# Patient Record
Sex: Male | Born: 1995 | Race: Black or African American | Hispanic: No | Marital: Single | Smoking: Never smoker
Health system: Southern US, Community
[De-identification: ages and names within clinical notes are randomized; demographics above are authoritative.]

## PROBLEM LIST (undated history)

## (undated) HISTORY — PX: EYE SURGERY: SHX253

## (undated) HISTORY — PX: TRACHEOSTOMY: SUR1362

---
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
## 2018-02-08 ENCOUNTER — Ambulatory Visit (INDEPENDENT_AMBULATORY_CARE_PROVIDER_SITE_OTHER)
Admission: EM | Admit: 2018-02-08 | Discharge: 2018-02-08 | Disposition: A | Discharge status: Home / Self Care | Payer: BLUE CROSS/BLUE SHIELD | Source: Home / Self Care | Attending: Family Medicine | Admitting: Family Medicine

## 2018-02-08 ENCOUNTER — Encounter (HOSPITAL_COMMUNITY): Payer: Self-pay

## 2018-02-08 ENCOUNTER — Encounter (HOSPITAL_COMMUNITY): Payer: Self-pay | Admitting: Emergency Medicine

## 2018-02-08 ENCOUNTER — Emergency Department (HOSPITAL_COMMUNITY)
Admission: EM | Admit: 2018-02-08 | Discharge: 2018-02-08 | Disposition: A | Discharge status: Home / Self Care | Payer: BLUE CROSS/BLUE SHIELD | Attending: Emergency Medicine | Admitting: Emergency Medicine

## 2018-02-08 ENCOUNTER — Other Ambulatory Visit: Payer: Self-pay

## 2018-02-08 DIAGNOSIS — Y9367 Activity, basketball: Secondary | ICD-10-CM | POA: Insufficient documentation

## 2018-02-08 DIAGNOSIS — S01512A Laceration without foreign body of oral cavity, initial encounter: Secondary | ICD-10-CM

## 2018-02-08 DIAGNOSIS — Y92310 Basketball court as the place of occurrence of the external cause: Secondary | ICD-10-CM | POA: Diagnosis not present

## 2018-02-08 DIAGNOSIS — S01511A Laceration without foreign body of lip, initial encounter: Secondary | ICD-10-CM | POA: Insufficient documentation

## 2018-02-08 DIAGNOSIS — W51XXXA Accidental striking against or bumped into by another person, initial encounter: Secondary | ICD-10-CM | POA: Diagnosis not present

## 2018-02-08 DIAGNOSIS — Y998 Other external cause status: Secondary | ICD-10-CM | POA: Insufficient documentation

## 2018-02-08 DIAGNOSIS — S0181XA Laceration without foreign body of other part of head, initial encounter: Secondary | ICD-10-CM | POA: Insufficient documentation

## 2018-02-08 MED ORDER — CHLORHEXIDINE GLUCONATE 0.12 % MT SOLN: 15.0000 mL | Freq: Two times a day (BID) | OROMUCOSAL | 0 refills | Status: DC

## 2018-02-08 MED ORDER — LIDOCAINE-EPINEPHRINE (PF) 2 %-1:200000 IJ SOLN
10.0000 mL | Freq: Once | INTRAMUSCULAR | Status: AC
  Administered 2018-02-08: 10 mL
  Filled 2018-02-08: qty 20

## 2018-02-08 MED ORDER — OXYCODONE-ACETAMINOPHEN 5-325 MG PO TABS
1.0000 | ORAL_TABLET | Freq: Once | ORAL | Status: AC
  Administered 2018-02-08: 1 via ORAL
  Filled 2018-02-08: qty 1

## 2018-02-08 NOTE — Discharge Instructions (Addendum)
GO TO ER °

## 2018-02-08 NOTE — ED Provider Notes (Signed)
[facility]    CSN: [id number] Arrival date & time: 02/08/18  1508     History   Chief Complaint Chief Complaint  Patient presents with  . Lip Laceration    HPI Vitaly Jasmer is a 23 y.o. male.   HPI  Patient was elbowed in the face during a basketball game.  He has a laceration on the side of his face, near the right lip. As I examine the laceration I realize it is a through and through laceration with a large opening inside his mouth, connecting. This is more complicated than we can repair at the urgent care center and I triaged him to the emergency room.  History reviewed. No pertinent past medical history.  There are no active problems to display for this patient.   Past Surgical History:  Procedure Laterality Date  . EYE SURGERY    . TRACHEOSTOMY         Home Medications    Prior to Admission medications   Medication Sig Start Date End Date Taking? Authorizing Provider  chlorhexidine (PERIDEX) 0.12 % solution Use as directed 15 mLs in the mouth or throat 2 (two) times daily. 02/08/18   Dietrich Pates, PA-C    Family History Family History  Problem Relation Age of Onset  . Healthy Mother   . Healthy Father     Social History Social History   Tobacco Use  . Smoking status: Never Smoker  . Smokeless tobacco: Never Used  Substance Use Topics  . Alcohol use: No  . Drug use: No     Allergies   Patient has no known allergies.   Review of Systems Review of Systems  Skin: Positive for wound.     Physical Exam Triage Vital Signs ED Triage Vitals  Enc Vitals Group     BP 02/08/18 1655 120/77     Pulse Rate 02/08/18 1655 71     Resp 02/08/18 1655 18     Temp 02/08/18 1655 99.3 F (37.4 C)     Temp Source 02/08/18 1655 Temporal     SpO2 02/08/18 1655 100 %     Weight --      Height --      Head Circumference --      Peak Flow --      Pain Score 02/08/18 1653 3     Pain Loc --      Pain Edu? --      Excl. in GC? --    No  data found.  Updated Vital Signs BP 120/77 (BP Location: Right Arm)   Pulse 71   Temp 99.3 F (37.4 C) (Temporal)   Resp 18   SpO2 100%   Visual Acuity Right Eye Distance:   Left Eye Distance:   Bilateral Distance:    Right Eye Near:   Left Eye Near:    Bilateral Near:     Physical Exam Constitutional:      General: He is not in acute distress.    Appearance: He is well-developed.  HENT:     Head: Normocephalic and atraumatic.   Eyes:     Conjunctiva/sclera: Conjunctivae normal.     Pupils: Pupils are equal, round, and reactive to light.  Neck:     Musculoskeletal: Normal range of motion.  Cardiovascular:     Rate and Rhythm: Normal rate.  Pulmonary:     Effort: Pulmonary effort is normal. No respiratory distress.  Abdominal:     General: There is no  distension.     Palpations: Abdomen is soft.  Musculoskeletal: Normal range of motion.  Skin:    General: Skin is warm and dry.  Neurological:     Mental Status: He is alert.  Psychiatric:        Mood and Affect: Mood normal.        Thought Content: Thought content normal.      UC Treatments / Results  Labs (all labs ordered are listed, but only abnormal results are displayed) Labs Reviewed - No data to display  EKG None  Radiology No results found.  Procedures Procedures (including critical care time)  Medications Ordered in UC Medications - No data to display  Initial Impression / Assessment and Plan / UC Course  I have reviewed the triage vital signs and the nursing notes.  Pertinent labs & imaging results that were available during my care of the patient were reviewed by me and considered in my medical decision making (see chart for details).      Final Clinical Impressions(s) / UC Diagnoses   Final diagnoses:  Complex laceration of circumoral region of face     Discharge Instructions     GO TO ER   ED Prescriptions    None     Controlled Substance Prescriptions [place] Controlled  Substance Registry consulted? Not Applicable   Eustace MooreNelson, Cheikh Bramble Sue, MD 02/08/18 2050
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
## 2018-02-08 NOTE — ED Provider Notes (Signed)
[facility] EMERGENCY DEPARTMENT Provider Note   CSN: [id number] Arrival date & time: 02/08/18  1749     History   Chief Complaint Chief Complaint  Patient presents with  . Laceration    HPI Howard Franklin is a 23 y.o. male who presents to ED for laceration that occurred 3 hours prior to arrival.  States that he was playing basketball when another player elbowed him in the corner of his mouth.  He is up-to-date on tetanus.  Denies loss of consciousness or other injuries, loose teeth.  HPI  History reviewed. No pertinent past medical history.  There are no active problems to display for this patient.   Past Surgical History:  Procedure Laterality Date  . EYE SURGERY    . TRACHEOSTOMY          Home Medications    Prior to Admission medications   Medication Sig Start Date End Date Taking? Authorizing Provider  chlorhexidine (PERIDEX) 0.12 % solution Use as directed 15 mLs in the mouth or throat 2 (two) times daily. 02/08/18   Dietrich Pates, PA-C    Family History Family History  Problem Relation Age of Onset  . Healthy Mother   . Healthy Father     Social History Social History   Tobacco Use  . Smoking status: Never Smoker  . Smokeless tobacco: Never Used  Substance Use Topics  . Alcohol use: No  . Drug use: No     Allergies   Patient has no known allergies.   Review of Systems Review of Systems  Gastrointestinal: Negative for vomiting.  Skin: Positive for wound.  Neurological: Negative for syncope and headaches.     Physical Exam Updated Vital Signs BP 116/79   Pulse 77   Temp 98.5 F (36.9 C) (Oral)   Resp 16   Ht 5\' 11"  (1.803 m)   Wt 65.8 kg   SpO2 100%   BMI 20.22 kg/m   Physical Exam Vitals signs and nursing note reviewed.  Constitutional:      General: He is not in acute distress.    Appearance: He is well-developed. He is not diaphoretic.  HENT:     Head: Normocephalic and atraumatic.  Eyes:     General:  No scleral icterus.    Conjunctiva/sclera: Conjunctivae normal.  Neck:     Musculoskeletal: Normal range of motion.  Pulmonary:     Effort: Pulmonary effort is normal. No respiratory distress.  Skin:    Findings: No rash.     Comments: Laceration at corner of mouth going through.  Neurological:     Mental Status: He is alert.          ED Treatments / Results  Labs (all labs ordered are listed, but only abnormal results are displayed) Labs Reviewed - No data to display  EKG None  Radiology No results found.  Procedures .Marland KitchenLaceration Repair Date/Time: 02/08/2018 8:09 PM Performed by: Dietrich Pates, PA-C Authorized by: Dietrich Pates, PA-C   Consent:    Consent obtained:  Verbal   Consent given by:  Patient   Risks discussed:  Infection, need for additional repair, nerve damage, pain, poor cosmetic result, poor wound healing, retained foreign body, tendon damage and vascular damage Anesthesia (see MAR for exact dosages):    Anesthesia method:  Local infiltration   Local anesthetic:  Lidocaine 2% WITH epi Laceration details:    Location:  Lip   Lip location:  Upper exterior lip   Length (cm):  1.5  Repair type:    Repair type:  Simple Exploration:    Hemostasis achieved with:  Direct pressure Treatment:    Area cleansed with:  Saline   Amount of cleaning:  Standard   Irrigation solution:  Sterile saline Skin repair:    Repair method:  Sutures   Suture size:  4-0   Suture material:  Nylon (Vicryl rapide)   Suture technique:  Simple interrupted   Number of sutures:  4 Approximation:    Approximation:  Close   Vermilion border: well-aligned   Post-procedure details:    Patient tolerance of procedure:  Tolerated well, no immediate complications  .Marland KitchenLaceration Repair Date/Time: 02/08/2018 8:11 PM Performed by: Dietrich Pates, PA-C Authorized by: Dietrich Pates, PA-C   Consent:    Consent obtained:  Verbal   Consent given by:  Patient   Risks discussed:   Infection, need for additional repair, nerve damage, pain, poor wound healing, retained foreign body, vascular damage, poor cosmetic result and tendon damage Anesthesia (see MAR for exact dosages):    Anesthesia method:  Local infiltration   Local anesthetic:  Lidocaine 2% WITH epi Laceration details:    Location:  Lip   Lip location:  Upper interior lip   Length (cm):  1.5 Pre-procedure details:    Preparation:  Patient was prepped and draped in usual sterile fashion Treatment:    Area cleansed with:  Saline   Amount of cleaning:  Standard   Irrigation solution:  Sterile saline   Irrigation method:  Syringe and pressure wash Skin repair:    Repair method:  Sutures   Suture size:  4-0   Wound skin closure material used: vicryl rapide.   Suture technique:  Simple interrupted   Number of sutures:  3 Approximation:    Approximation:  Loose Post-procedure details:    Patient tolerance of procedure:  Tolerated well, no immediate complications   (including critical care time)  Medications Ordered in ED Medications  lidocaine-EPINEPHrine (XYLOCAINE W/EPI) 2 %-1:200000 (PF) injection 10 mL (10 mLs Infiltration Given by Other 02/08/18 1851)  oxyCODONE-acetaminophen (PERCOCET/ROXICET) 5-325 MG per tablet 1 tablet (1 tablet Oral Given 02/08/18 1858)     Initial Impression / Assessment and Plan / ED Course  I have reviewed the triage vital signs and the nursing notes.  Pertinent labs & imaging results that were available during my care of the patient were reviewed by me and considered in my medical decision making (see chart for details).     Patient counseled on wound care. Patient counseled on need to return or see PCP/urgent care for suture removal in 5 days. Patient was urged to return to the Emergency Department urgently with worsening pain, swelling, expanding erythema especially if it streaks away from the affected area, fever, or if they have any other concerns.  Will discharge  home with chlorhexidine mouthwash for internal laceration.  Patient verbalized understanding.   Patient is hemodynamically stable, in NAD, and able to ambulate in the ED. Evaluation does not show pathology that would require ongoing emergent intervention or inpatient treatment. I explained the diagnosis to the patient. Pain has been managed and has no complaints prior to discharge. Patient is comfortable with above plan and is stable for discharge at this time. All questions were answered prior to disposition. Strict return precautions for returning to the ED were discussed. Encouraged follow up with PCP.    Portions of this note were generated with Scientist, clinical (histocompatibility and immunogenetics). Dictation errors may occur despite best attempts at  proofreading.   Final Clinical Impressions(s) / ED Diagnoses   Final diagnoses:  Facial laceration, initial encounter  Laceration of internal mouth, initial encounter    ED Discharge Orders         Ordered    chlorhexidine (PERIDEX) 0.12 % solution  2 times daily     02/08/18 2009           Dietrich PatesKhatri, Atthew Coutant, [place]PA-C 02/08/18 2013    Sabas SousBero, Michael M, MD 02/08/18 2338
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
## 2018-02-08 NOTE — Discharge Instructions (Signed)
Return in 5 days for suture removal.  The sutures on the inside of her mouth will absorb when they are ready. Use chlorhexidine. Return to ED for worsening symptoms, signs of infection including redness, drainage or swelling of the area.

## 2018-02-10 ENCOUNTER — Encounter (HOSPITAL_COMMUNITY): Payer: Self-pay | Admitting: Emergency Medicine

## 2018-02-10 ENCOUNTER — Emergency Department (HOSPITAL_COMMUNITY)
Admission: EM | Admit: 2018-02-10 | Discharge: 2018-02-10 | Disposition: A | Discharge status: Home / Self Care | Payer: BLUE CROSS/BLUE SHIELD | Attending: Emergency Medicine | Admitting: Emergency Medicine

## 2018-02-10 ENCOUNTER — Other Ambulatory Visit: Payer: Self-pay

## 2018-02-10 DIAGNOSIS — Z4800 Encounter for change or removal of nonsurgical wound dressing: Secondary | ICD-10-CM | POA: Insufficient documentation

## 2018-02-10 DIAGNOSIS — S01512D Laceration without foreign body of oral cavity, subsequent encounter: Secondary | ICD-10-CM | POA: Diagnosis not present

## 2018-02-10 DIAGNOSIS — X58XXXD Exposure to other specified factors, subsequent encounter: Secondary | ICD-10-CM | POA: Insufficient documentation

## 2018-02-10 MED ORDER — PENICILLIN V POTASSIUM 500 MG PO TABS: 500.0000 mg | ORAL_TABLET | Freq: Four times a day (QID) | ORAL | 0 refills | Status: AC

## 2018-02-10 NOTE — ED Provider Notes (Signed)
[facility] Provider Note   CSN: [id number] Arrival date & time: 02/10/18  [id number]     History   Chief Complaint Chief Complaint  Patient presents with  . Oral Swelling    HPI Howard Franklin is a 23 y.o. male.  HPI   23 year old male presents today with facial laceration wound check.  Patient was seen on 02/08/2018 (2 days ago) for a laceration to his face.  Patient suffered a through and through laceration to the corner of the right mouth.  He had stitches placed and was discharged.  Patient notes at the time of laceration repair he had significant swelling, he notes the swelling has persisted waxing and waning since that time.  Patient denies any fever discharge or purulence, he is concerned that the swelling has not gone down.   History reviewed. No pertinent past medical history.  There are no active problems to display for this patient.   Past Surgical History:  Procedure Laterality Date  . EYE SURGERY    . TRACHEOSTOMY          Home Medications    Prior to Admission medications   Medication Sig Start Date End Date Taking? Authorizing Provider  chlorhexidine (PERIDEX) 0.12 % solution Use as directed 15 mLs in the mouth or throat 2 (two) times daily. 02/08/18   Khatri, Hina, PA-C  penicillin v potassium (VEETID) 500 MG tablet Take 1 tablet (500 mg total) by mouth 4 (four) times daily for 7 days. 02/10/18 02/17/18  Eyvonne MechanicHedges, Walker Paddack, PA-C    Family History Family History  Problem Relation Age of Onset  . Healthy Mother   . Healthy Father     Social History Social History   Tobacco Use  . Smoking status: Never Smoker  . Smokeless tobacco: Never Used  Substance Use Topics  . Alcohol use: No  . Drug use: No     Allergies   Patient has no known allergies.   Review of Systems Review of Systems  All other systems reviewed and are negative.    Physical Exam Updated Vital Signs BP 123/84 (BP Location: Right Arm)    Pulse 88   Temp 98.7 F (37.1 C) (Oral)   Resp 16   Ht 5\' 11"  (1.803 m)   Wt 65.8 kg   SpO2 98%   BMI 20.22 kg/m   Physical Exam Vitals signs and nursing note reviewed.  Constitutional:      Appearance: He is well-developed.  HENT:     Head: Normocephalic and atraumatic.     Comments: Intraoral laceration with 2 loose stitches, no purulence-3 simple interrupted Prolene stitches along the right corner of the mouth with no surrounding redness or discharge-swelling noted surrounding the injury Eyes:     General: No scleral icterus.       Right eye: No discharge.        Left eye: No discharge.     Conjunctiva/sclera: Conjunctivae normal.     Pupils: Pupils are equal, round, and reactive to light.  Neck:     Musculoskeletal: Normal range of motion.     Vascular: No JVD.     Trachea: No tracheal deviation.  Pulmonary:     Effort: Pulmonary effort is normal.     Breath sounds: No stridor.  Neurological:     Mental Status: He is alert and oriented to person, place, and time.     Coordination: Coordination normal.  Psychiatric:        Behavior:  Behavior normal.        Thought Content: Thought content normal.        Judgment: Judgment normal.      ED Treatments / Results  Labs (all labs ordered are listed, but only abnormal results are displayed) Labs Reviewed - No data to display  EKG None  Radiology No results found.  Procedures Procedures (including critical care time)  Medications Ordered in ED Medications - No data to display   Initial Impression / Assessment and Plan / ED Course  I have reviewed the triage vital signs and the nursing notes.  Pertinent labs & imaging results that were available during my care of the patient were reviewed by me and considered in my medical decision making (see chart for details).      Discharge Meds: Penicillin   Assessment/Plan: 23 year old male presents today for wound check.  Patient does have swelling around the site  of the injury, he has no purulence, no erythema, low suspicion for acute infection although this is still a possibility.  Given the nature of the wound with no significant improvement I do find reasonable start the patient on antibiotics.  I did not remove the stitches as there are no signs of purulence and his intraoral laceration is not closely approximated allowing for drainage if any purulence were to form.  Patient encouraged to continue outpatient management follow-up for suture removal as indicated and return immediately for any new or worsening signs or symptoms.  Patient verbalized understanding and agreement to today's plan had no further questions or concerns      Final Clinical Impressions(s) / ED Diagnoses   Final diagnoses:  Laceration of oral cavity, subsequent encounter    ED Discharge Orders         Ordered    penicillin v potassium (VEETID) 500 MG tablet  4 times daily     02/10/18 0924           Eyvonne Mechanic, PA-C 02/10/18 1245    Pricilla Loveless, MD 02/11/18 1149

## 2018-02-10 NOTE — ED Triage Notes (Signed)
Pt reports stitches 2 days ago to right lip, now with swelling at same area. Denies recent injury, pain or fever to affected area. Appears clean dry, intact. VSS.

## 2018-02-10 NOTE — ED Notes (Signed)
Pt verbalized understanding of discharge instructions and denies any further questions at this time.   

## 2018-02-10 NOTE — Discharge Instructions (Addendum)
Please read attached information. If you experience any new or worsening signs or symptoms please return to the emergency room for evaluation. Please follow-up with your primary care provider or specialist as discussed. Please use medication prescribed only as directed and discontinue taking if you have any concerning signs or symptoms.   °

## 2018-02-13 ENCOUNTER — Ambulatory Visit (HOSPITAL_COMMUNITY)
Admission: EM | Admit: 2018-02-13 | Discharge: 2018-02-13 | Disposition: A | Discharge status: Home / Self Care | Payer: BLUE CROSS/BLUE SHIELD

## 2018-02-13 DIAGNOSIS — Z4802 Encounter for removal of sutures: Secondary | ICD-10-CM

## 2018-02-13 NOTE — ED Triage Notes (Signed)
Pt presents to have 4 sutures removed from side of lip.

## 2019-08-02 ENCOUNTER — Encounter (HOSPITAL_COMMUNITY): Payer: Self-pay

## 2019-08-02 ENCOUNTER — Ambulatory Visit (HOSPITAL_COMMUNITY)
Admission: EM | Admit: 2019-08-02 | Discharge: 2019-08-02 | Disposition: A | Discharge status: Home / Self Care | Payer: BLUE CROSS/BLUE SHIELD | Attending: Family Medicine | Admitting: Family Medicine

## 2019-08-02 ENCOUNTER — Other Ambulatory Visit: Payer: Self-pay

## 2019-08-02 ENCOUNTER — Ambulatory Visit (INDEPENDENT_AMBULATORY_CARE_PROVIDER_SITE_OTHER): Payer: BLUE CROSS/BLUE SHIELD

## 2019-08-02 DIAGNOSIS — S99912A Unspecified injury of left ankle, initial encounter: Secondary | ICD-10-CM

## 2019-08-02 DIAGNOSIS — S93402A Sprain of unspecified ligament of left ankle, initial encounter: Secondary | ICD-10-CM

## 2019-08-02 DIAGNOSIS — M25572 Pain in left ankle and joints of left foot: Secondary | ICD-10-CM | POA: Diagnosis not present

## 2019-08-02 MED ORDER — IBUPROFEN 800 MG PO TABS: 800.0000 mg | ORAL_TABLET | Freq: Three times a day (TID) | ORAL | 0 refills | Status: DC

## 2019-08-02 NOTE — ED Triage Notes (Signed)
Pt was playing basketball 2 days ago and a guy fell on the pt's left ankle and his ankle rolled. Pt used crutches to get to exam room. PT has 2+ swelling of left ankle, pt has 2+ left pedal pulse, cap refill less than 3 sec, pt able to wiggle toes. Pt denies numbness and tingling.

## 2019-08-02 NOTE — ED Provider Notes (Signed)
[facility]    CSN: [id number] Arrival date & time: 08/02/19  1119      History   Chief Complaint Chief Complaint  Patient presents with  . Ankle Injury    HPI Howard Franklin is a 24 y.o. male.   HPI  Patient injured his ankle playing basketball 2 days ago He states that he fell, and then another player fell on top of him, injuring his ankle He has not been able to comfortably bear weight on his ankle since that time He is ambulating on crutches   History reviewed. No pertinent past medical history.  There are no problems to display for this patient.   Past Surgical History:  Procedure Laterality Date  . EYE SURGERY    . TRACHEOSTOMY         Home Medications    Prior to Admission medications   Medication Sig Start Date End Date Taking? Authorizing Provider  ibuprofen (ADVIL) 800 MG tablet Take 1 tablet (800 mg total) by mouth 3 (three) times daily. 08/02/19   Eustace Moore, MD    Family History Family History  Problem Relation Age of Onset  . Healthy Mother   . Healthy Father     Social History Social History   Tobacco Use  . Smoking status: Never Smoker  . Smokeless tobacco: Never Used  Substance Use Topics  . Alcohol use: No  . Drug use: No     Allergies   Patient has no known allergies.   Review of Systems Review of Systems See HPI  Physical Exam Triage Vital Signs ED Triage Vitals  Enc Vitals Group     BP 08/02/19 1213 113/64     Pulse Rate 08/02/19 1213 65     Resp 08/02/19 1213 16     Temp 08/02/19 1213 98.9 F (37.2 C)     Temp Source 08/02/19 1213 Oral     SpO2 08/02/19 1213 100 %     Weight 08/02/19 1214 145 lb (65.8 kg)     Height 08/02/19 1214 5' 10.5" (1.791 m)     Head Circumference --      Peak Flow --      Pain Score 08/02/19 1213 6     Pain Loc --      Pain Edu? --      Excl. in GC? --    No data found.  Updated Vital Signs BP 113/64   Pulse 65   Temp 98.9 F (37.2 C) (Oral)   Resp 16    Ht 5' 10.5" (1.791 m)   Wt 65.8 kg   SpO2 100%   BMI 20.51 kg/m      Physical Exam Constitutional:      General: He is not in acute distress.    Appearance: He is well-developed and normal weight.  HENT:     Head: Normocephalic and atraumatic.     Mouth/Throat:     Comments: Mask is in place Eyes:     Conjunctiva/sclera: Conjunctivae normal.     Pupils: Pupils are equal, round, and reactive to light.  Cardiovascular:     Rate and Rhythm: Normal rate.  Pulmonary:     Effort: Pulmonary effort is normal. No respiratory distress.  Abdominal:     General: There is no distension.     Palpations: Abdomen is soft.  Musculoskeletal:        General: Normal range of motion.     Cervical back: Normal range of motion.  Comments: Left ankle has soft tissue swelling medial greater than lateral.  Flat feet.  No ecchymosis.  Tenderness of the lateral as well as medial malleolus.  Mild tenderness over the anterior ankle.  Limited range of motion by 50%.  No instability  Skin:    General: Skin is warm and dry.  Neurological:     Mental Status: He is alert.     Gait: Gait abnormal.  Psychiatric:        Mood and Affect: Mood normal.        Behavior: Behavior normal.      UC Treatments / Results  Labs (all labs ordered are listed, but only abnormal results are displayed) Labs Reviewed - No data to display  EKG   Radiology DG Ankle Complete Left  Result Date: 08/02/2019 CLINICAL DATA:  Ankle injury 2 days ago. EXAM: LEFT ANKLE COMPLETE - 3+ VIEW COMPARISON:  None. FINDINGS: Normal alignment no fracture. Ankle joint effusion. Ankle joint space normal. IMPRESSION: Ankle joint effusion.  Negative for fracture. Electronically Signed   By: Marlan Palau M.D.   On: 08/02/2019 12:52    Procedures Procedures (including critical care time)  Medications Ordered in UC Medications - No data to display  Initial Impression / Assessment and Plan / UC Course  I have reviewed the triage  vital signs and the nursing notes.  Pertinent labs & imaging results that were available during my care of the patient were reviewed by me and considered in my medical decision making (see chart for details).     Ankle sprain, discussed conservative management Final Clinical Impressions(s) / UC Diagnoses   Final diagnoses:  Acute left ankle pain  Sprain of left ankle, unspecified ligament, initial encounter     Discharge Instructions     Take ibuprofen 3 times a day with food This is for pain and inflammation Wear brace when you are upright to prevent reinjury and falls You should be able to wean out of your brace over the next 2 to 3 weeks If you continue to have pain or problems I recommend follow-up with sports medicine   ED Prescriptions    Medication Sig Dispense Auth. Provider   ibuprofen (ADVIL) 800 MG tablet Take 1 tablet (800 mg total) by mouth 3 (three) times daily. 21 tablet Eustace Moore, MD     PDMP not reviewed this encounter.   Eustace Moore, MD 08/02/19 [phone]

## 2019-08-02 NOTE — Discharge Instructions (Addendum)
Take ibuprofen 3 times a day with food This is for pain and inflammation Wear brace when you are upright to prevent reinjury and falls You should be able to wean out of your brace over the next 2 to 3 weeks If you continue to have pain or problems I recommend follow-up with sports medicine

## 2019-12-06 ENCOUNTER — Encounter (HOSPITAL_COMMUNITY): Payer: Self-pay

## 2019-12-06 ENCOUNTER — Ambulatory Visit (HOSPITAL_COMMUNITY)
Admission: EM | Admit: 2019-12-06 | Discharge: 2019-12-06 | Disposition: A | Discharge status: Home / Self Care | Payer: BC Managed Care – PPO | Attending: Family Medicine | Admitting: Family Medicine

## 2019-12-06 ENCOUNTER — Other Ambulatory Visit: Payer: Self-pay

## 2019-12-06 ENCOUNTER — Ambulatory Visit (INDEPENDENT_AMBULATORY_CARE_PROVIDER_SITE_OTHER): Payer: BC Managed Care – PPO

## 2019-12-06 DIAGNOSIS — M25572 Pain in left ankle and joints of left foot: Secondary | ICD-10-CM

## 2019-12-06 DIAGNOSIS — S8252XA Displaced fracture of medial malleolus of left tibia, initial encounter for closed fracture: Secondary | ICD-10-CM | POA: Diagnosis not present

## 2019-12-06 DIAGNOSIS — M25562 Pain in left knee: Secondary | ICD-10-CM

## 2019-12-06 MED ORDER — IBUPROFEN 800 MG PO TABS: 800.0000 mg | ORAL_TABLET | Freq: Three times a day (TID) | ORAL | 0 refills | Status: AC
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
## 2019-12-06 NOTE — Progress Notes (Signed)
Orthopedic Tech Progress Note Patient Details:  Howard Franklin 27-Oct-1995 [id number]  Ortho Devices Type of Ortho Device: Post (short leg) splint, Stirrup splint Splint Material: Fiberglass Ortho Device/Splint Location: Left Lower Extremity Ortho Device/Splint Interventions: Ordered, Application   Post Interventions Patient Tolerated: Well Instructions Provided: Care of device, Poper ambulation with device   Howard Franklin 12/06/2019, 2:01 PM
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
## 2019-12-06 NOTE — ED Notes (Signed)
Ortho tech called 

## 2019-12-08 ENCOUNTER — Ambulatory Visit (INDEPENDENT_AMBULATORY_CARE_PROVIDER_SITE_OTHER): Payer: BC Managed Care – PPO | Admitting: Orthopaedic Surgery

## 2019-12-08 ENCOUNTER — Other Ambulatory Visit: Payer: Self-pay

## 2019-12-08 ENCOUNTER — Encounter: Payer: Self-pay | Admitting: Orthopaedic Surgery

## 2019-12-08 DIAGNOSIS — S93402A Sprain of unspecified ligament of left ankle, initial encounter: Secondary | ICD-10-CM | POA: Diagnosis not present
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
## 2019-12-30 ENCOUNTER — Ambulatory Visit (INDEPENDENT_AMBULATORY_CARE_PROVIDER_SITE_OTHER): Payer: BC Managed Care – PPO | Admitting: Physician Assistant

## 2019-12-30 ENCOUNTER — Encounter: Payer: Self-pay | Admitting: Physician Assistant

## 2019-12-30 DIAGNOSIS — S93402A Sprain of unspecified ligament of left ankle, initial encounter: Secondary | ICD-10-CM | POA: Diagnosis not present
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
## 2020-02-01 ENCOUNTER — Ambulatory Visit: Payer: BC Managed Care – PPO | Admitting: Orthopaedic Surgery

## 2020-05-07 ENCOUNTER — Ambulatory Visit (HOSPITAL_COMMUNITY)
Admission: EM | Admit: 2020-05-07 | Discharge: 2020-05-07 | Disposition: A | Discharge status: Home / Self Care | Payer: BC Managed Care – PPO | Attending: Emergency Medicine | Admitting: Emergency Medicine

## 2020-05-07 ENCOUNTER — Encounter (HOSPITAL_COMMUNITY): Payer: Self-pay

## 2020-05-07 ENCOUNTER — Other Ambulatory Visit: Payer: Self-pay

## 2020-05-07 DIAGNOSIS — Z1152 Encounter for screening for COVID-19: Secondary | ICD-10-CM

## 2020-05-07 DIAGNOSIS — R519 Headache, unspecified: Secondary | ICD-10-CM

## 2020-05-07 DIAGNOSIS — R0981 Nasal congestion: Secondary | ICD-10-CM

## 2020-05-07 DIAGNOSIS — J302 Other seasonal allergic rhinitis: Secondary | ICD-10-CM | POA: Diagnosis present

## 2020-05-07 LAB — SARS CORONAVIRUS 2 (TAT 6-24 HRS): SARS Coronavirus 2: POSITIVE — AB

## 2020-05-07 NOTE — Discharge Instructions (Signed)
Take your Zyrtec daily.  You can also use Flonase 2 sprays in each nostril daily.  Make sure you use medications every day for long-term management.  We will contact you if your Covid test is positive.  You can take Tylenol and/or Ibuprofen as needed for fever reduction and pain relief.   For sore throat management: try warm salt water gargles, cepacol lozenges, throat spray, warm tea or water with lemon/honey, or OTC cold relief medicine for throat discomfort.    For congestion: take a daily anti-histamine like Zyrtec, Claritin, and a oral decongestant to help with post nasal drip that may be irritating your throat. You can also use Flonase 2 sprays in each nostril daily.    It is important to stay hydrated: drink plenty of fluids (water, gatorade/powerade/pedialyte, juices, or teas) to keep your throat moisturized and help further relieve irritation/discomfort.   Return or go to the Emergency Department if symptoms worsen or do not improve in the next few days.

## 2020-05-07 NOTE — ED Triage Notes (Signed)
Pt in with c/o headaches for 2 days  Pt has been taking motrin with temporary relief   Denies any runny nose , congestion, cough

## 2020-05-07 NOTE — ED Provider Notes (Signed)
[facility]    CSN: [id number] Arrival date & time: 05/07/20  1021      History   Chief Complaint Chief Complaint  Patient presents with  . Headache    HPI Howard Franklin is a 25 y.o. male.   Here for evaluation of headaches for the past 2 days.  Denies any fevers, nasal congestion, cough, or loss of taste or smell.  Reports having a history of seasonal allergies and has not been taking his Zyrtec daily.  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  Reports there are rumors of Covid positive patients sneaking out of the quarantine dorms. Denies any specific alleviating or aggravating factors.  Denies any fevers, chest pain, shortness of breath, N/V/D, numbness, tingling, weakness, abdominal pain.   ROS: As per HPI, all other pertinent ROS negative   The history is provided by the patient.  Headache Associated symptoms: no fever     Past Medical History:  Diagnosis Date  . TBI (traumatic brain injury) (HCC) 2016    There are no problems to display for this patient.   Past Surgical History:  Procedure Laterality Date  . EYE SURGERY    . TRACHEOSTOMY         Home Medications    Prior to Admission medications   Medication Sig Start Date End Date Taking? Authorizing Provider  ibuprofen (ADVIL) 800 MG tablet Take 1 tablet (800 mg total) by mouth 3 (three) times daily. 12/06/19   Particia Nearing, PA-C  ketorolac (TORADOL) 10 MG tablet ketorolac 10 mg tablet  Take 1 tablet every 6 hours by oral route for 5 days. 06/16/17   [provider]    Family History Family History  Problem Relation Age of Onset  . Healthy Mother   . Healthy Father     Social History Social History   Tobacco Use  . Smoking status: Never Smoker  . Smokeless tobacco: Never Used  Substance Use Topics  . Alcohol use: No  . Drug use: No     Allergies   Patient has no known allergies.   Review of Systems Review of  Systems  Constitutional: Negative for fever.  Neurological: Positive for headaches.     Physical Exam Triage Vital Signs ED Triage Vitals  Enc Vitals Group     BP 05/07/20 1058 126/86     Pulse Rate 05/07/20 1058 77     Resp 05/07/20 1058 17     Temp 05/07/20 1058 98.6 F (37 C)     Temp src --      SpO2 05/07/20 1058 99 %     Weight --      Height --      Head Circumference --      Peak Flow --      Pain Score 05/07/20 1057 5     Pain Loc --      Pain Edu? --      Excl. in GC? --    No data found.  Updated Vital Signs BP 126/86   Pulse 77   Temp 98.6 F (37 C)   Resp 17   SpO2 99%   Visual Acuity Right Eye Distance:   Left Eye Distance:   Bilateral Distance:    Right Eye Near:   Left Eye Near:    Bilateral Near:     Physical Exam Vitals and nursing note reviewed.  Constitutional:      General: He is  not in acute distress.    Appearance: Normal appearance. He is not ill-appearing, toxic-appearing or diaphoretic.  HENT:     Head: Normocephalic and atraumatic.     Nose: Nose normal.     Right Sinus: No maxillary sinus tenderness or frontal sinus tenderness.     Left Sinus: No maxillary sinus tenderness or frontal sinus tenderness.  Eyes:     Extraocular Movements: Extraocular movements intact.     Conjunctiva/sclera: Conjunctivae normal.     Pupils: Pupils are equal, round, and reactive to light.  Cardiovascular:     Rate and Rhythm: Normal rate.     Pulses: Normal pulses.  Pulmonary:     Effort: Pulmonary effort is normal.     Breath sounds: Normal breath sounds.  Abdominal:     General: Abdomen is flat.  Musculoskeletal:        General: Normal range of motion.     Cervical back: Normal range of motion.  Skin:    General: Skin is warm and dry.  Neurological:     General: No focal deficit present.     Mental Status: He is alert and oriented to person, place, and time.     GCS: GCS eye subscore is 4. GCS verbal subscore is 5. GCS motor subscore  is 6.  Psychiatric:        Mood and Affect: Mood normal.      UC Treatments / Results  Labs (all labs ordered are listed, but only abnormal results are displayed) Labs Reviewed  SARS CORONAVIRUS 2 (TAT 6-24 HRS)    EKG   Radiology No results found.  Procedures Procedures (including critical care time)  Medications Ordered in UC Medications - No data to display  Initial Impression / Assessment and Plan / UC Course  I have reviewed the triage vital signs and the nursing notes.  Pertinent labs & imaging results that were available during my care of the patient were reviewed by me and considered in my medical decision making (see chart for details).     System negative for red flags or concerns including acute sinusitis.  Likely related to seasonal allergies.  Recommend taking Zyrtec daily and adding in Flonase daily.  We will check a Covid test due to concerns about possible exposures at school festival.  Can take Tylenol and or ibuprofen as needed.  Follow-up with primary care.   Final Clinical Impressions(s) / UC Diagnoses   Final diagnoses:  Sinus headache  Nasal congestion  Seasonal allergies  Encounter for screening for COVID-19     Discharge Instructions     Take your Zyrtec daily.  You can also use Flonase 2 sprays in each nostril daily.  Make sure you use medications every day for long-term management.  We will contact you if your Covid test is positive.  You can take Tylenol and/or Ibuprofen as needed for fever reduction and pain relief.   For sore throat management: try warm salt water gargles, cepacol lozenges, throat spray, warm tea or water with lemon/honey, or OTC cold relief medicine for throat discomfort.    For congestion: take a daily anti-histamine like Zyrtec, Claritin, and a oral decongestant to help with post nasal drip that may be irritating your throat. You can also use Flonase 2 sprays in each nostril daily.    It is important to stay  hydrated: drink plenty of fluids (water, gatorade/powerade/pedialyte, juices, or teas) to keep your throat moisturized and help further relieve irritation/discomfort.   Return or  go to the Emergency Department if symptoms worsen or do not improve in the next few days.     ED Prescriptions    None     PDMP not reviewed this encounter.   Ivette Loyal, NP 05/07/20 1145

## 2020-06-22 ENCOUNTER — Encounter (HOSPITAL_COMMUNITY): Payer: Self-pay

## 2020-06-22 ENCOUNTER — Ambulatory Visit (INDEPENDENT_AMBULATORY_CARE_PROVIDER_SITE_OTHER): Payer: BC Managed Care – PPO

## 2020-06-22 ENCOUNTER — Other Ambulatory Visit: Payer: Self-pay

## 2020-06-22 ENCOUNTER — Ambulatory Visit (HOSPITAL_COMMUNITY)
Admission: EM | Admit: 2020-06-22 | Discharge: 2020-06-22 | Disposition: A | Discharge status: Home / Self Care | Payer: BC Managed Care – PPO | Attending: Urgent Care | Admitting: Urgent Care

## 2020-06-22 DIAGNOSIS — M25472 Effusion, left ankle: Secondary | ICD-10-CM

## 2020-06-22 DIAGNOSIS — S93402A Sprain of unspecified ligament of left ankle, initial encounter: Secondary | ICD-10-CM

## 2020-06-22 DIAGNOSIS — M25572 Pain in left ankle and joints of left foot: Secondary | ICD-10-CM

## 2020-06-22 DIAGNOSIS — Z8781 Personal history of (healed) traumatic fracture: Secondary | ICD-10-CM

## 2020-06-22 DIAGNOSIS — S82892A Other fracture of left lower leg, initial encounter for closed fracture: Secondary | ICD-10-CM | POA: Diagnosis not present

## 2020-06-22 MED ORDER — HYDROCODONE-ACETAMINOPHEN 5-325 MG PO TABS: 1.0000 | ORAL_TABLET | Freq: Four times a day (QID) | ORAL | 0 refills | Status: AC | PRN

## 2020-06-22 MED ORDER — NAPROXEN 500 MG PO TABS: 500.0000 mg | ORAL_TABLET | Freq: Two times a day (BID) | ORAL | 0 refills | Status: AC

## 2020-06-22 NOTE — Discharge Instructions (Addendum)
Please schedule naproxen twice daily with food for your severe pain.  If you still have pain despite taking naproxen regularly, this is breakthrough pain.  You can use hydrocodone, a narcotic pain medicine, once every 4-6 hours for this.  Once your pain is better controlled, switch back to just naproxen.  Please reach out to your previous orthopedist for an appointment regarding further management of your new ankle fracture.  Otherwise, you can contact Dr. Victorino Dike with [facility] if you are unable to get with your previous doctor.

## 2020-06-22 NOTE — ED Triage Notes (Signed)
Pt presents with left ankle injury after rolling it while playing basketball yesterday.

## 2020-06-22 NOTE — Progress Notes (Signed)
Orthopedic Tech Progress Note Patient Details:  Howard Franklin 14-Aug-1995 [id number]  Ortho Devices Type of Ortho Device: Stirrup splint,Post (short leg) splint Ortho Device/Splint Location: LLE Ortho Device/Splint Interventions: Ordered,Application,Adjustment   Post Interventions Patient Tolerated: Well Instructions Provided: Care of device,Poper ambulation with device   Howard Franklin 06/22/2020, 10:19 AM
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
## 2022-02-08 IMAGING — DX DG ANKLE COMPLETE 3+V*L*
3 series · 3 of 3 positions shown · non-contrast
Comparison: December 06, 2019

CLINICAL DATA: Rolling injury while playing basketball

EXAM:
LEFT ANKLE COMPLETE - 3+ VIEW

[ankle ap]
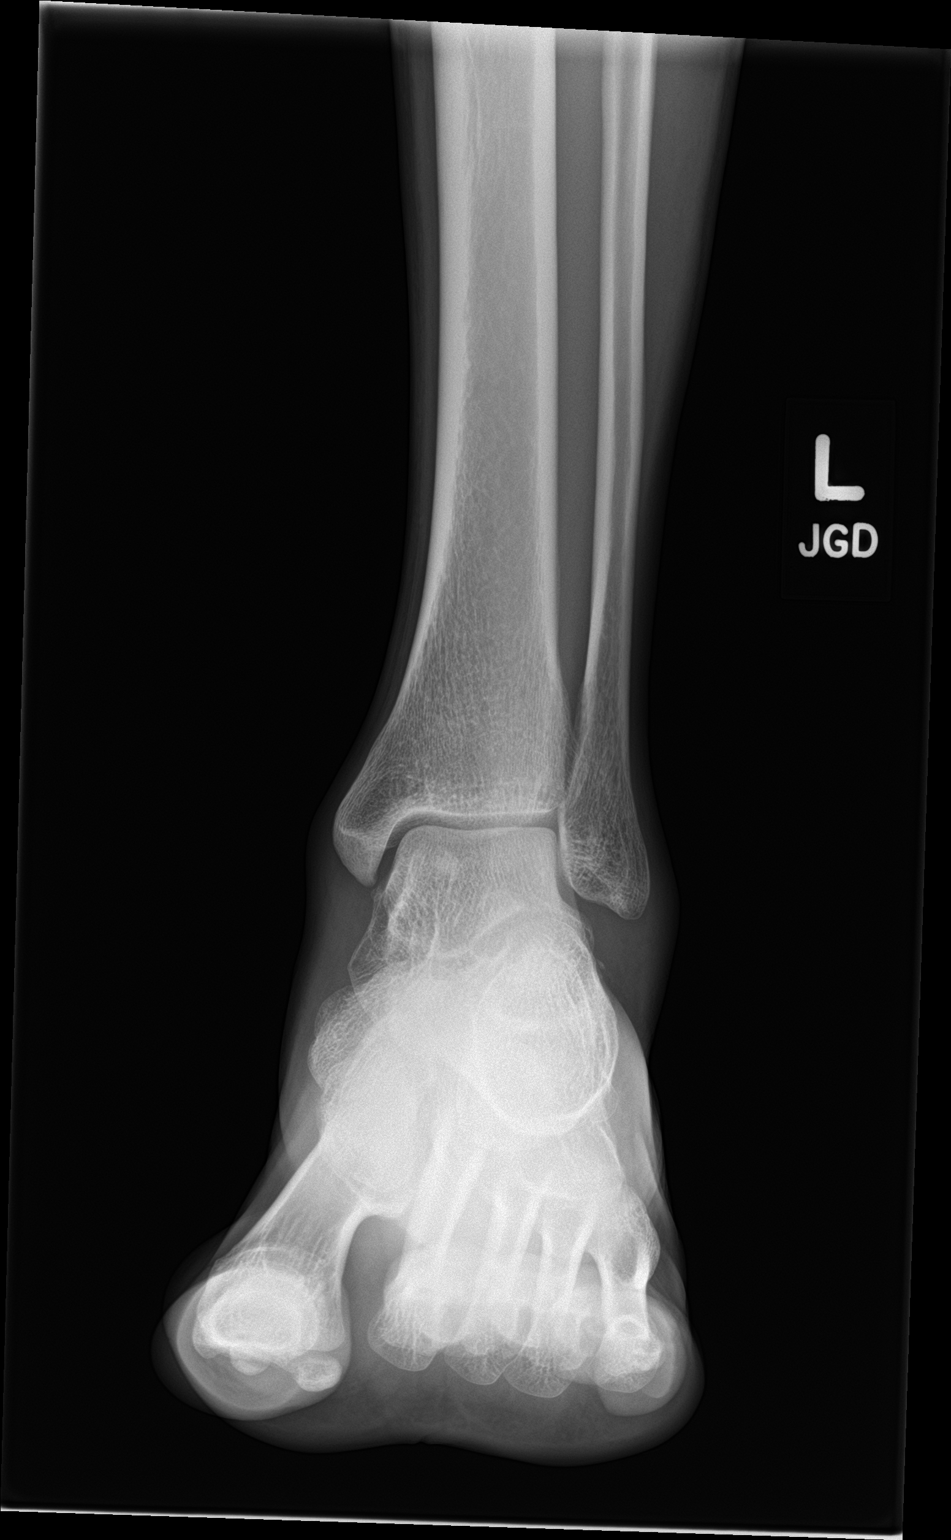

[ankle obl]
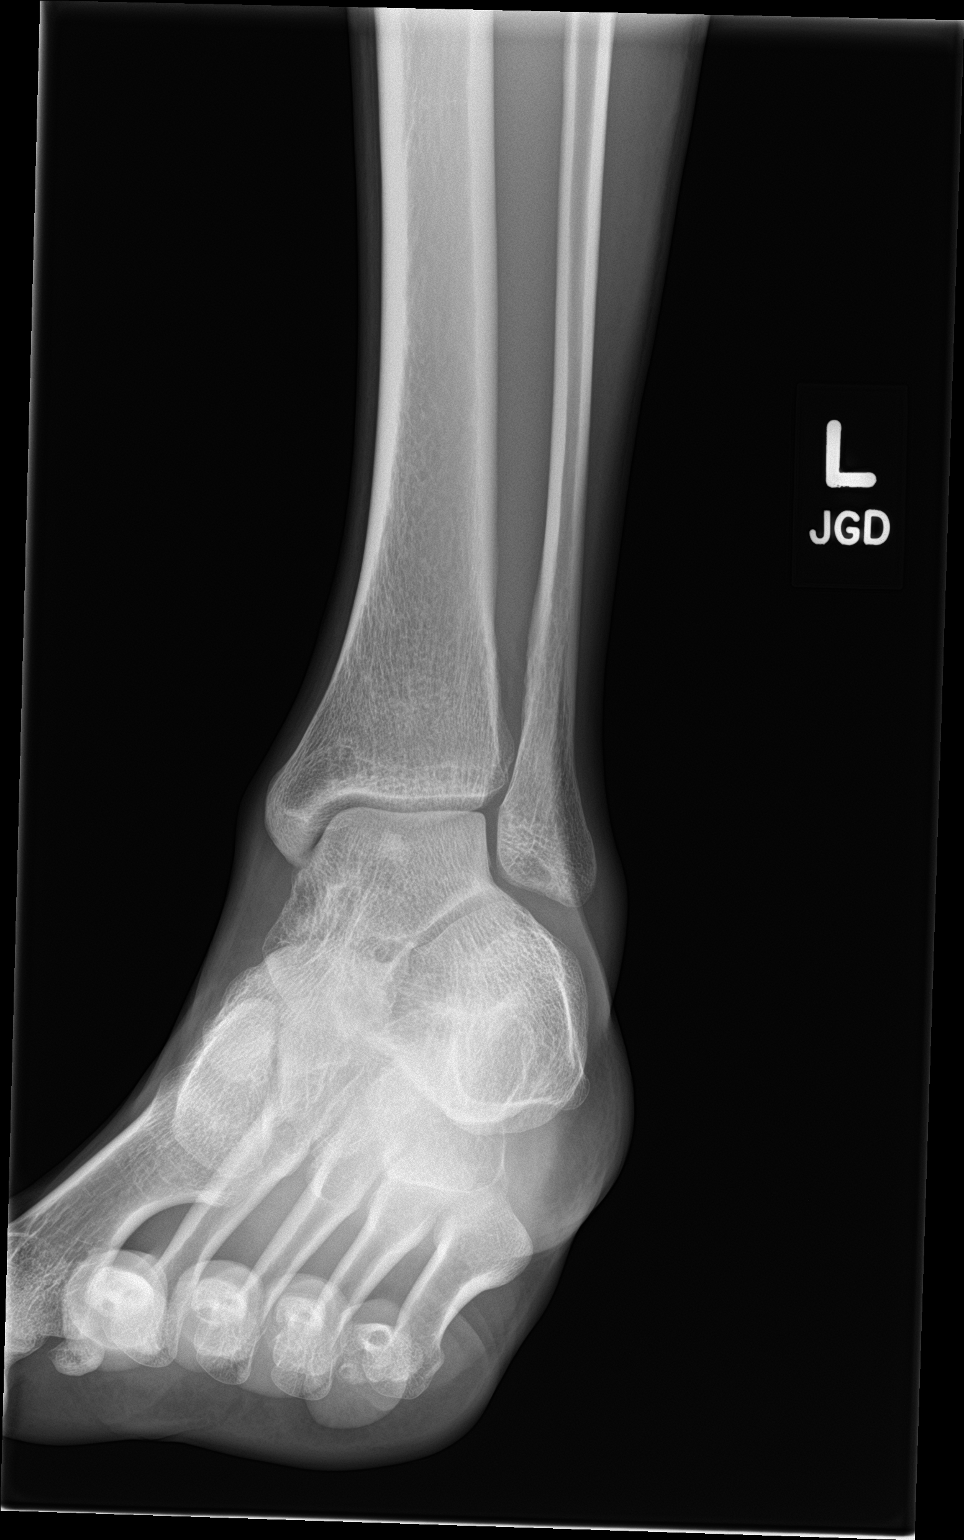

[ankle lat]
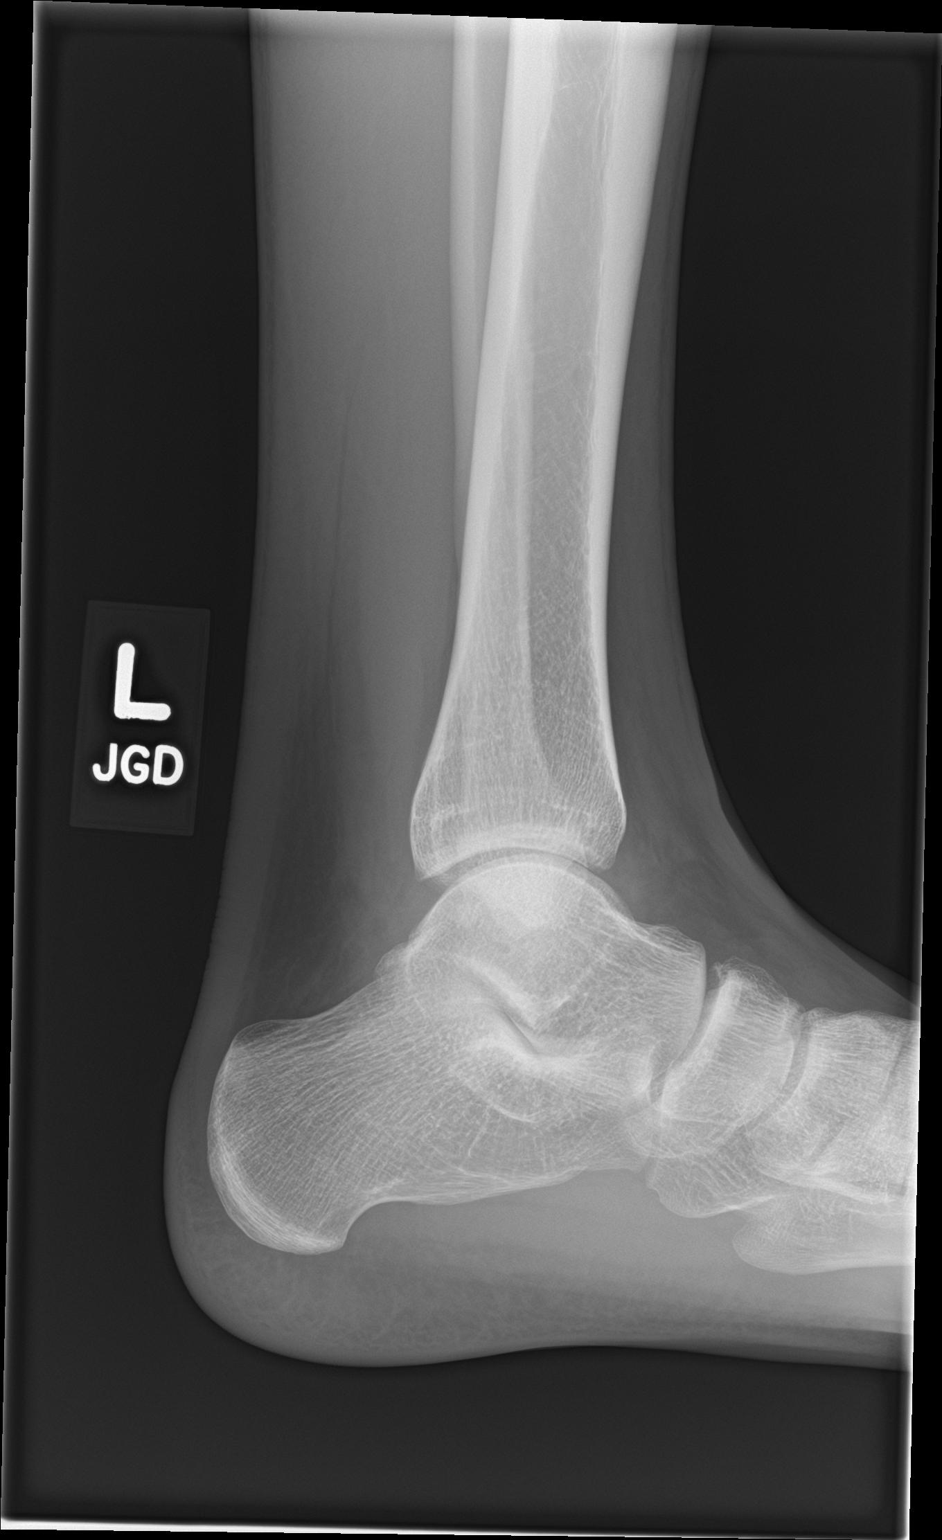

[3 of 3 positions shown; findings below may reference images not displayed]

FINDINGS: Frontal, oblique, and lateral views were obtained. There is mild
soft tissue swelling. There is evidence of a prior fracture of the
medial malleolus with remodeling. There is a tiny calcification
immediately lateral to the hindfoot seen only on the frontal view, a
potential small avulsion. No other findings suggesting potential
acute fracture. No appreciable joint effusion. No joint space
narrowing or erosion. Ankle mortise appears intact.
IMPRESSION: Suspect tiny avulsion medially lateral to the hindfoot seen only on
the frontal view. Exact location of this apparent small avulsion
uncertain. Evidence of prior fracture with healing medial malleolus.
Mild soft tissue swelling. Ankle mortise appears intact. No
appreciable arthropathy.

These results will be called to the ordering clinician or
representative by the [HOSPITAL] at the imaging location.
